# Patient Record
Sex: Female | Born: 1975 | Race: Black or African American | Hispanic: No | Marital: Married | Smoking: Never smoker
Health system: Southern US, Community
[De-identification: ages and names within clinical notes are randomized; demographics above are authoritative.]

## PROBLEM LIST (undated history)

## (undated) DIAGNOSIS — R000 Tachycardia, unspecified: Secondary | ICD-10-CM

## (undated) DIAGNOSIS — J302 Other seasonal allergic rhinitis: Secondary | ICD-10-CM

## (undated) HISTORY — PX: ABLATION OF DYSRHYTHMIC FOCUS: SHX254

---

## 1998-03-10 ENCOUNTER — Emergency Department (HOSPITAL_COMMUNITY): Admission: EM | Admit: 1998-03-10 | Discharge: 1998-03-10 | Payer: Self-pay | Admitting: Internal Medicine

## 2001-01-04 ENCOUNTER — Emergency Department (HOSPITAL_COMMUNITY): Admission: EM | Admit: 2001-01-04 | Discharge: 2001-01-04 | Payer: Self-pay | Admitting: Emergency Medicine

## 2001-01-04 ENCOUNTER — Encounter: Payer: Self-pay | Admitting: Emergency Medicine

## 2007-03-19 ENCOUNTER — Emergency Department (HOSPITAL_COMMUNITY): Admission: EM | Admit: 2007-03-19 | Discharge: 2007-03-19 | Payer: Self-pay | Admitting: Emergency Medicine

## 2008-04-04 ENCOUNTER — Encounter (INDEPENDENT_AMBULATORY_CARE_PROVIDER_SITE_OTHER): Payer: Self-pay | Admitting: Obstetrics and Gynecology

## 2008-04-04 ENCOUNTER — Ambulatory Visit: Payer: Self-pay | Admitting: Vascular Surgery

## 2008-04-04 ENCOUNTER — Ambulatory Visit: Admission: RE | Admit: 2008-04-04 | Discharge: 2008-04-04 | Payer: Self-pay | Admitting: Obstetrics and Gynecology

## 2008-04-27 ENCOUNTER — Inpatient Hospital Stay (HOSPITAL_COMMUNITY): Admission: AD | Admit: 2008-04-27 | Discharge: 2008-04-30 | Payer: Self-pay | Admitting: Obstetrics and Gynecology

## 2008-04-27 ENCOUNTER — Encounter (INDEPENDENT_AMBULATORY_CARE_PROVIDER_SITE_OTHER): Payer: Self-pay | Admitting: Obstetrics and Gynecology

## 2009-08-29 ENCOUNTER — Encounter: Payer: Self-pay | Admitting: Cardiovascular Disease

## 2009-09-07 DIAGNOSIS — R002 Palpitations: Secondary | ICD-10-CM

## 2009-09-12 ENCOUNTER — Ambulatory Visit: Payer: Self-pay | Admitting: Cardiovascular Disease

## 2009-09-12 ENCOUNTER — Telehealth (INDEPENDENT_AMBULATORY_CARE_PROVIDER_SITE_OTHER): Payer: Self-pay | Admitting: *Deleted

## 2009-09-14 ENCOUNTER — Ambulatory Visit: Payer: Self-pay

## 2009-09-14 ENCOUNTER — Ambulatory Visit: Payer: Self-pay | Admitting: Internal Medicine

## 2009-09-14 ENCOUNTER — Ambulatory Visit (HOSPITAL_COMMUNITY): Admission: RE | Admit: 2009-09-14 | Discharge: 2009-09-14 | Payer: Self-pay | Admitting: Cardiovascular Disease

## 2009-09-14 ENCOUNTER — Encounter: Payer: Self-pay | Admitting: Cardiovascular Disease

## 2009-09-18 ENCOUNTER — Telehealth (INDEPENDENT_AMBULATORY_CARE_PROVIDER_SITE_OTHER): Payer: Self-pay | Admitting: *Deleted

## 2009-09-18 ENCOUNTER — Ambulatory Visit: Payer: Self-pay | Admitting: Internal Medicine

## 2009-09-19 ENCOUNTER — Telehealth: Payer: Self-pay | Admitting: Internal Medicine

## 2009-10-02 ENCOUNTER — Telehealth: Payer: Self-pay | Admitting: Internal Medicine

## 2009-10-03 ENCOUNTER — Telehealth: Payer: Self-pay | Admitting: Internal Medicine

## 2009-10-04 ENCOUNTER — Encounter: Payer: Self-pay | Admitting: Internal Medicine

## 2009-10-16 ENCOUNTER — Telehealth (INDEPENDENT_AMBULATORY_CARE_PROVIDER_SITE_OTHER): Payer: Self-pay | Admitting: *Deleted

## 2009-10-17 ENCOUNTER — Ambulatory Visit: Payer: Self-pay | Admitting: Internal Medicine

## 2009-10-17 ENCOUNTER — Telehealth (INDEPENDENT_AMBULATORY_CARE_PROVIDER_SITE_OTHER): Payer: Self-pay | Admitting: *Deleted

## 2009-10-17 LAB — CONVERTED CEMR LAB
BUN: 8 mg/dL (ref 6–23)
Basophils Absolute: 0 10*3/uL (ref 0.0–0.1)
Basophils Relative: 0.3 % (ref 0.0–3.0)
CO2: 30 meq/L (ref 19–32)
Calcium: 9.1 mg/dL (ref 8.4–10.5)
Chloride: 105 meq/L (ref 96–112)
Creatinine, Ser: 0.8 mg/dL (ref 0.4–1.2)
Eosinophils Absolute: 0.1 10*3/uL (ref 0.0–0.7)
Eosinophils Relative: 1.7 % (ref 0.0–5.0)
GFR calc non Af Amer: 107.17 mL/min (ref >60)
Glucose, Bld: 108 mg/dL — ABNORMAL HIGH (ref 70–99)
HCT: 43.2 % (ref 36.0–46.0)
Hemoglobin: 13.7 g/dL (ref 12.0–15.0)
INR: 1.1 — ABNORMAL HIGH (ref 0.8–1.0)
Lymphocytes Relative: 28.3 % (ref 12.0–46.0)
Lymphs Abs: 2.2 10*3/uL (ref 0.7–4.0)
MCHC: 31.7 g/dL (ref 30.0–36.0)
MCV: 84.1 fL (ref 78.0–100.0)
Monocytes Absolute: 0.3 10*3/uL (ref 0.1–1.0)
Monocytes Relative: 3.4 % (ref 3.0–12.0)
Neutro Abs: 5.1 10*3/uL (ref 1.4–7.7)
Neutrophils Relative %: 66.3 % (ref 43.0–77.0)
Platelets: 212 10*3/uL (ref 150.0–400.0)
Potassium: 3.7 meq/L (ref 3.5–5.1)
Prothrombin Time: 11.4 s (ref 9.1–11.7)
RBC: 5.13 M/uL — ABNORMAL HIGH (ref 3.87–5.11)
RDW: 13.6 % (ref 11.5–14.6)
Sodium: 142 meq/L (ref 135–145)
WBC: 7.7 10*3/uL (ref 4.5–10.5)
aPTT: 25.1 s (ref 21.7–28.8)
hCG, Beta Chain, Quant, S: <0.5 milliintl units/mL

## 2009-10-18 ENCOUNTER — Telehealth: Payer: Self-pay | Admitting: Internal Medicine

## 2009-10-23 ENCOUNTER — Ambulatory Visit: Payer: Self-pay | Admitting: Internal Medicine

## 2009-10-23 ENCOUNTER — Ambulatory Visit (HOSPITAL_COMMUNITY): Admission: RE | Admit: 2009-10-23 | Discharge: 2009-10-24 | Payer: Self-pay | Admitting: Internal Medicine

## 2009-10-31 ENCOUNTER — Telehealth: Payer: Self-pay | Admitting: Internal Medicine

## 2009-11-20 ENCOUNTER — Ambulatory Visit: Payer: Self-pay | Admitting: Internal Medicine

## 2009-11-20 DIAGNOSIS — I471 Supraventricular tachycardia: Secondary | ICD-10-CM

## 2010-08-15 ENCOUNTER — Telehealth: Payer: Self-pay | Admitting: Internal Medicine

## 2010-08-15 ENCOUNTER — Emergency Department (HOSPITAL_COMMUNITY)
Admission: EM | Admit: 2010-08-15 | Discharge: 2010-08-15 | Payer: Self-pay | Source: Home / Self Care | Admitting: Emergency Medicine

## 2010-08-21 NOTE — Assessment & Plan Note (Signed)
Summary: nep/ ablation, svt  / per dr. Eden Emms appt is 3:45/gd   Visit Type:  Initial Consult Primary Provider:  Celestia Khat, MD  CC:  When pt went to ER.  and her heart rate 1was 190.Marland Kitchen  History of Present Illness: Virginia Vasquez is referred today by Dr. 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  The patient has had good health until several weeks ago when she first experienced SVT and required Adenosine for termination.  She was placed on beta blockers and had a recurrent episode earlier today and was found to have recurrent SVT while wearing a cardiac monitor.  She has a narrow QRS tachycardia at 220/min with QRS alternans.  These episodes are associated with dizziness, chest pressure and sob. No syncope.  Current Medications (verified): 1)  Metoprolol Succinate 50 Mg Xr24h-Tab (Metoprolol Succinate) .... Take One Tablet By Mouth Daily 2)  Zyrtec Allergy 10 Mg Tabs (Cetirizine Hcl) .... As Needed 3)  Diltiazem Hcl Er Beads 120 Mg Xr24h-Cap (Diltiazem Hcl Er Beads) .... Take One Capsule By Mouth Twice A Day  Allergies (verified): 1)  ! Vioxx  Past History:  Past Medical History: Last updated: 09/07/2009 Current Problems:  PALPITATIONS (ICD-785.1)  Past Surgical History: Last updated: 09/07/2009   Primary low segment transverse cesarean section.      Family History: Last updated: 09/07/2009  She has a family history of hypertension, diabetes,   lung cancer, and ovarian cancer.   Social History: Last updated: 09/12/2009  She is a nonsmoker and nondrinker.  Denies domestic or   physical violence.  Dentist.  Married One son named Cristal Deer  Review of Systems       All systems reviewed and negative except as noted in the HPI.  Vital Signs:  Patient profile:   35 year old female Height:      65 inches Weight:      171 pounds BMI:     28.56 Pulse rate:   85 / minute BP sitting:   102 / 62  (left arm)  Vitals Entered By: Laurance Flatten CMA (September 18, 2009 3:11  PM)  Physical Exam  General:  Affect appropriate Healthy:  appears stated age HEENT: normal Neck supple with no adenopathy JVP normal no bruits no thyromegaly Lungs clear with no wheezing and good diaphragmatic motion Heart:  S1/S2 no murmur,rub, gallop or click PMI normal Abdomen: benighn, BS positve, no tenderness, no AAA no bruit.  No HSM or HJR Distal pulses intact with no bruits No edema Neuro non-focal Skin warm and dry    Event Monitor  Procedure date:  09/18/2009  Findings:      NSR with SVT at 220/min.  No ventricular pre-excitation.  Impression & Recommendations:  Problem # 1:  PALPITATIONS (ICD-785.1) She has documented SVT despite medical therapy with beta blockers and now calcium channel blocker.  I have discussed the treatment options with the paitent and the risk/benefit/goal/expectations of EP study and RF ablation of her SVT have been discussed and she will call us if she decides to schedule an ablation. Her updated medication list for this problem includes:    Metoprolol Succinate 50 Mg Xr24h-tab (Metoprolol succinate) .Marland Kitchen... Take one tablet by mouth daily    Diltiazem Hcl Er Beads 120 Mg Xr24h-cap (Diltiazem hcl er beads) .Marland Kitchen... Take one capsule by mouth twice a day

## 2010-08-21 NOTE — Assessment & Plan Note (Signed)
Summary: np3/ palps/ pt has bcbs/ gd   CC:  palpitations.  History of Present Illness: Virginia Vasquez is seen today at the request of her primary Dr Alferd Apa.  She was in the [place] ER on 2/7 with palpitations that made her feel presyncopal or light headed.  She describes rapid heart beats that lasted minutes.  ER eval was negative including CT.  I don't have blood work to see if TSH was checked.  No previous heart disease, murmur.  No new meds, stress or drugs.  Symptoms persisting with additional symptom of headache.  No true syncope, does get SSCP on occasion with palpitations.  Feels weak and not herself the last month.  No fever.    Cardiovascular Risk History:      Negative major cardiovascular risk factors include female age less than 99 years old.    Current Problems (verified): 1)  Palpitations  (ICD-785.1)  Current Medications (verified): 1)  Metoprolol Succinate 50 Mg Xr24h-Tab (Metoprolol Succinate) .... Take One Tablet By Mouth Daily 2)  Zyrtec Allergy 10 Mg Tabs (Cetirizine Hcl) .... As Needed  Past History:  Past Medical History: Last updated: 09/07/2009 Current Problems:  PALPITATIONS (ICD-785.1)  Past Surgical History: Last updated: 09/07/2009   Primary low segment transverse cesarean section.      Family History: Last updated: 09/07/2009  She has a family history of hypertension, diabetes,   lung cancer, and ovarian cancer.   Social History: Last updated: 09/12/2009  She is a nonsmoker and nondrinker.  Denies domestic or   physical violence.  Dentist.  Married One son named Corporate treasurer  Social History:  She is a nonsmoker and nondrinker.  Denies domestic or   physical violence.  Dentist.  Married One son named Cristal Deer  Review of Systems       Denies fever, malais, weight loss, blurry vision, decreased visual acuity, cough, sputum, SOB, hemoptysis, pleuritic pain,s, heartburn, abdominal pain, melena, lower extremity edema,  claudication, or rash. All other systems reviewed and negative  Vital Signs:  Patient profile:   35 year old female Height:      65 inches Weight:      171 pounds BMI:     28.56 Pulse rate:   90 / minute Resp:     12 per minute BP sitting:   123 / 77  (left arm)  Vitals Entered By: Kem Parkinson (September 12, 2009 2:01 PM)  Physical Exam  General:  Affect appropriate Healthy:  appears stated age HEENT: normal Neck supple with no adenopathy JVP normal no bruits no thyromegaly Lungs clear with no wheezing and good diaphragmatic motion Heart:  S1/S2 no murmur,rub, gallop or click PMI normal Abdomen: benighn, BS positve, no tenderness, no AAA no bruit.  No HSM or HJR Distal pulses intact with no bruits No edema Neuro non-focal Skin warm and dry    Impression & Recommendations:  Problem # 1:  PALPITATIONS (ICD-785.1) Wtih SSCP, and presyncope.  Negative ER w/u , normal ECG and normal exam.  F/U event monitor and echo to R/O structural heart disease.  Continue BB for now.  f/U in 6 weeds  R/O SVT Her updated medication list for this problem includes:    Metoprolol Succinate 50 Mg Xr24h-tab (Metoprolol succinate) .Marland Kitchen... Take one tablet by mouth daily  Orders: Echocardiogram (Echo) Event (Event)  Cardiovascular Risk Assessment/Plan:      The patient's hypertensive risk group is category A: No risk factors and no target organ damage.  Today's blood pressure  is 123/77.    Patient Instructions: 1)  Your physician recommends that you schedule a follow-up appointment in: 6 WEEKS 2)  Your physician has requested that you have an echocardiogram.  Echocardiography is a painless test that uses sound waves to create images of your heart. It provides your doctor with information about the size and shape of your heart and how well your heart's chambers and valves are working.  This procedure takes approximately one hour. There are no restrictions for this procedure. 3)  Your  physician has recommended that you wear an event monitor.  Event monitors are medical devices that record the heart's electrical activity. Doctors most often use these monitors to diagnose arrhythmias. Arrhythmias are problems with the speed or rhythm of the heartbeat. The monitor is a small, portable device. You can wear one while you do your normal daily activities. This is usually used to diagnose what is causing palpitations/syncope (passing out).     EKG Report  Procedure date:  09/12/2009  Findings:      NSR  Normal ECG

## 2010-08-21 NOTE — Progress Notes (Signed)
Summary: ttalk to nurse/ calling  back to set up procedure  Phone Note Call from Patient Call back at Home Phone [phone]   Caller: Patient Summary of Call: pt would like to talk to nurse. Initial call taken by: Edman Circle,  September 19, 2009 2:44 PM  Follow-up for Phone Call        [facility] for pt to call me back to sch SVT ablation Dennis Bast, RN, BSN  September 19, 2009 4:26 PM  Per pt calling [phone] Lorne Skeens  September 21, 2009 9:21 AM  called pt back and gave her several days to consider.  She will talk with her husband, who works at the same school, and call me back. Dennis Bast, RN, BSN  September 21, 2009 9:36 AM

## 2010-08-21 NOTE — Assessment & Plan Note (Signed)
Summary: eph/post ablation per Matt/lg   Visit Type:  Follow-up Primary Provider:  Celestia Khat, MD   History of Present Illness: Ms. Ringel returns today for followup.  She is a pleasant 35 yo woman with a h/o SVT who underwent EPS/RFA several weeks ago.  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  She denies c/p, sob, or SVT.  She has rare palpitations.  Current Medications (verified): 1)  Zyrtec Allergy 10 Mg Tabs (Cetirizine Hcl) .... As Needed  Allergies (verified): 1)  ! Vioxx  Past History:  Past Medical History: Last updated: 09/07/2009 Current Problems:  PALPITATIONS (ICD-785.1)  Past Surgical History: Last updated: 09/07/2009   Primary low segment transverse cesarean section.      Review of Systems  The patient denies chest pain, syncope, dyspnea on exertion, and peripheral edema.    Vital Signs:  Patient profile:   35 year old female Height:      65 inches Weight:      173 pounds BMI:     28.89 Pulse rate:   73 / minute BP sitting:   140 / 86  (left arm)  Vitals Entered By: Laurance Flatten CMA (Nov 20, 2009 10:19 AM)  Physical Exam  General:  Affect appropriate Healthy:  appears stated age HEENT: normal Neck supple with no adenopathy JVP normal no bruits no thyromegaly Lungs clear with no wheezing and good diaphragmatic motion Heart:  S1/S2 no murmur,rub, gallop or click PMI normal Abdomen: benighn, BS positve, no tenderness, no AAA no bruit.  No HSM or HJR Distal pulses intact with no bruits No edema Neuro non-focal Skin warm and dry    EKG  Procedure date:  11/20/2009  Findings:      Normal sinus rhythm with rate of:  78.  Impression & Recommendations:  Problem # 1:  PSVT (ICD-427.0) No recurrent symptoms since ablation She is off of her meds.  She is instructed to call us for recurrent SVT.  Problem # 2:  PALPITATIONS (ICD-785.1) Her symptoms are minimal at this time.  She will  call us if they return.  Other Orders: EKG w/ Interpretation (93000)  Patient Instructions: 1)  Your physician recommends that you schedule a follow-up appointment as needed  Prevention & Chronic Care Immunizations   Influenza vaccine: Not documented    Tetanus booster: Not documented    Pneumococcal vaccine: Not documented  Other Screening   Pap smear: Not documented   Smoking status: Not documented

## 2010-08-21 NOTE — Progress Notes (Signed)
Summary: set up ablation**lmtcb**  Phone Note Call from Patient Call back at Home Phone [phone]   Caller: Patient Reason for Call: Talk to Nurse Details for Reason: Per pt calling, pt aware kelly is not in office today, set up ablation. Initial call taken by: Lorne Skeens,  October 02, 2009 9:51 AM  Follow-up for Phone Call        spoke with pt, procedure tentatively scheduled for 11-13-09 at 7:30am. will foward to [facility], dr taylor's nurse to confirm with the pt and to go over instructions. pt aware of date and time but aware kelly to call to confirm Deliah Goody, RN  October 02, 2009 10:52 AM Procedure changed to 10/23/09  pt aware and will come in for labs on 10/17/09 Dennis Bast, RN, BSN  October 09, 2009 9:32 AM

## 2010-08-21 NOTE — Progress Notes (Signed)
Summary: elevated hr/sob  Phone Note Call from Patient Call back at Home Phone [phone]   Caller: Patient Reason for Call: Talk to Nurse Summary of Call: heart racing for the last , alittle SOB Initial call taken by: Migdalia Dk,  October 03, 2009 10:07 AM  Follow-up for Phone Call        school nurse called from [employer] had been called to the school because pt had an episode of fast heart rate--per school nurse EMS was with the patient and was going to make a determination about further treatment -I advised the nurse EMS was in a better position to make further recommendations but evaluation in ER was an option

## 2010-08-21 NOTE — Progress Notes (Signed)
  Phone Note Other Incoming   Caller: Elsie Ra Summary of Call: Pt had 45 min of SVT last night with c/o SOB, heart racing, numbness in face and finger tips, Dr. Gala Romney was paged by Kenyon Ana. Not certain what he said but Kenyon Ana ask the husband to take her to the nearest ER because the pt began to panic. She was taken to [facility] ER around 0800 and Dr. Chandra Batch called him to let him know that the pt was given medication and she was back in NSR.  Initial call taken by: Duncan Dull, RN, BSN,  September 18, 2009 9:06 AM     Appended Document:  Lynetta Mare to get patient in to see GT today for ? AV node modification.  Add Cardizem 240

## 2010-08-21 NOTE — Progress Notes (Signed)
Summary: need information in regards to surgery  Phone Note Call from Patient Call back at Home Phone [phone]   Caller: Patient Reason for Call: Talk to Nurse Summary of Call: procedure scheudle for monday. info. Initial call taken by: Lorne Skeens,  October 18, 2009 2:32 PM  Follow-up for Phone Call        was suppose to pick up instructions the day she had her labs.  Left instructions on her voicemail and let her know we can fax if we need too.  She can call back and leave [place] a # to fax back to Dennis Bast, RN, BSN  October 18, 2009 2:52 PM

## 2010-08-21 NOTE — Progress Notes (Signed)
  Faxed Cardiac over to Joycelyn Schmid w/ [employer] to fax [phone] [facility]  October 16, 2009 10:24 AM

## 2010-08-21 NOTE — Consult Note (Signed)
Summary: [facility]  [facility]   Imported By: Marylou Mccoy 09/07/2009 16:04:32  _____________________________________________________________________  External Attachment:    Type:   Image     Comment:   External Document

## 2010-08-21 NOTE — Progress Notes (Signed)
  Faxed ROI over to [facility] @ fax [phone]. Virginia Vasquez  September 12, 2009 3:10 PM    Appended Document:  Recieved Records form [facility] forwarded to [place]

## 2010-08-21 NOTE — Letter (Signed)
Summary: ELectrophysiology/Ablation Procedure Instructions  [employer], Main Office  [address]   [place], [place] [address]   Phone: [phone]  Fax: [phone]     Electrophysiology/Ablation Procedure Instructions    You are scheduled for a(n) SVT ablation on 10/23/09 at 8:30am with Dr. Ladona Ridgel.  1.  Please come to the Short Stay Center at [facility] at 6:30am on the day of your procedure.  2.  Come prepared to stay overnight.   Please bring your insurance cards and a list of your medications.  3.  Come to the [place] office on 10/17/09 at 1:00pm  for lab work.  The lab at [facility] is open from 8:30 AM to 1:30 PM and 2:30 PM to 5:00 PM.  You do not have to be fasting.  4.  Do not have anything to eat or drink after midnight the night before your procedure.  5.   All of your remaining medications may be taken with a small amount of water.  6.  Educational material received:   _ Ablation   * Occasionally, EP studies and ablations can become lengthy.  Please make your family aware of this before your procedure starts.  Average time ranges from 2-8 hours for EP studies/ablations.  Your physician will locate your family after the procedure with the results.  * If you have any questions after you get home, please call the office at [phone]. Anselm Pancoast

## 2010-08-21 NOTE — Progress Notes (Signed)
Summary: Lump at cath site size of a grape  Phone Note Call from Patient Call back at Home Phone [phone]   Caller: Patient Summary of Call: Pt have lump at cath site  Initial call taken by: Judie Grieve,  October 31, 2009 10:45 AM  Follow-up for Phone Call        lmom to call me back  Dennis Bast, RN, BSN  October 31, 2009 10:58 AM Never rec'd call back from pt.  Called again to make sure everything was ok.  LMOM again.  Asked to call the office if she felt she needed to have the area looked at. Dennis Bast, RN, BSN  November 02, 2009 12:41 PM

## 2010-08-21 NOTE — Progress Notes (Signed)
  Pt dropped off FMLA papers @ [employer]..she signed ROI,but I was not able to give her the Packet...she left.Marland Kitchen.forwarded to [employer] for processing [facility]  October 17, 2009 1:03 PM    Appended Document:  Forwarded FMLA,ROI over to [employer] ( papers were left on my desk)   Appended Document:  Pt called checking Status Of FMLA papers, she says no one ever contacted her, I let her know I had papers and they were Completed. She will Pick- up today   Appended Document:  Pt, came and picked up FMLA papers   Appended Document:  Pt called back with Concerns on FMLA papers, Part A # 1 was filled out Incorrectly,Part B # 5 was also not filled in with Dates of work missed, I called and spoke with Fleet Contras and asked if she could complete this and fax back to me Pt was here I faxed the FMLA papers over to Her, she said she would complete and get Pam H to Initial that it was corrected, She faxed paper back to me but it was Incorrect,And I was also told Pam H would not sign I needed to get one of the Nurse's over here to Complete. I went back to the Pt in the Lobby spoke with pt about this she aslo stated it was incorrect, and we walked Down to her Car together for her to give me all the Information of where the FMLA needs to be sent, Fax and Phone # of Ignatius Specking. I told her I would Personally fax these papers over myself and call her when all complete. She will come back and p/up Original copy.Pt was ok with the whole process,Just a little anxious these papers were dropped off on 10/17/2009  Appended Document:  On 11/06/09 I was asked by Fleet Contras, ([employer] Rep) to initial FMLA papers because the patient arrived at [facility]. requesting a statement to be added to papers about the patient needing 2 more weeks added to her FMLA for an ablation. Fleet Contras told me that she couldn't find anything about the ablation. I told her I didn't feel good about initialing it if she couldn't find anything about it  and that if she needed to add this it would be best to have the doctor approve & re-sign.  She said she would let Selena Batten know and send it back for the nurse to review and for signature.  Appended Document:  Dr.Nishan reviewed & Signed New FMLA papers that Gina fixed, I faxed over to [facility] and also called pt to let her know they are ready to be picked up

## 2010-08-23 NOTE — Progress Notes (Signed)
Summary: c/o chestpain. foot isnumb  Phone Note Call from Patient Call back at Home Phone [phone]   Caller: Patient Reason for Call: Talk to Nurse Complaint: Chest Pain Summary of Call: c/o left foot going numb on yesterday and this am. chestpain. no meds was taken.  Initial call taken by: Lorne Skeens,  August 15, 2010 9:16 AM  Follow-up for Phone Call        pt called now hands going numb, doesn't want a call, going to er i have paged trish  Glynda Jaeger  August 15, 2010 9:29 AM  noted that pt going to er and Rosann Auerbach has been called. will forward to md.  Follow-up by: Claris Gladden RN,  August 15, 2010 10:08 AM

## 2010-12-04 NOTE — Op Note (Signed)
Virginia Vasquez, Virginia Vasquez NO.:  [id number]   MEDICAL RECORD NO.:  [id number]          PATIENT TYPE:  INP   LOCATION:  9110                          FACILITY:  [facility]   PHYSICIAN:  Lenoard Aden, M.D.DATE OF BIRTH:  06-20-1976   DATE OF PROCEDURE:  DATE OF DISCHARGE:                               OPERATIVE REPORT   PREOPERATIVE DIAGNOSES:  Active phase arrest, term intrauterine  pregnancy, maternal fever in labor, fetal tachycardia.   POSTOPERATIVE DIAGNOSES:  Active phase arrest, term intrauterine  pregnancy, maternal fever in labor, fetal tachycardia, plus persistent  occiput posterior position.   PROCEDURE:  Primary low segment transverse cesarean section.   SURGEON:  Lenoard Aden, MD   ANESTHESIA:  Epidural by Oddono.   ESTIMATED BLOOD LOSS:  1000 mL.   COMPLICATIONS:  None.   DRAINS:  Foley.   COUNTS:  Correct.   The patient recovered in good condition.   FINDINGS:  Full-term living female, occiput posterior position, Apgars 8  and 9.  Pediatrician in attendance.  Two-layer uterine closure, normal  tubes, normal ovaries.   BRIEF OPERATIVE NOTE:  Being apprised of risks of anesthesia, infection,  bleeding, intra-abdominal injury, need for repair, delayed versus  immediate complications include bowel and bladder injury.  The patient  was brought to the operating where she was administered a dosing of  epidural anesthetic without complications, prepped, and draped in usual  sterile fashion.  Foley catheter placed after achieving adequate  anesthesia, dilute Marcaine solution placed.  Pfannenstiel skin incision  made with the scalpel, carried down the fascia which nicked in the  midline and then transversely using Mayo scissors, rectus muscles  dissected sharply in midline, peritoneum entered sharply.  Bladder blade  placed.  Visceral peritoneum scored sharply off the liver in segment.  Kerr hysterotomy incision made atraumatic delivery from  occiput  posterior position.  Full-term living female was noted, Apgars 8 and 9.  Cord blood collected.  Placenta remained manually intact, three-vessel  cord, uterus curetted using a dry lap pack and closed in 2 running  imbricating layers of 0 Monocryl suture.  Multiple interrupted sutures  placed in the midline for hemostasis.  Normal tubes, normal ovaries palpable.  Inspection reveals a good  hemostatic bladder flap.  Fascia was then closed using a 0 Monocryl in  continuous running fashion.  Skin closed using staples.  The patient  tolerated the procedure well and was transferred to recovery in good  condition.      Lenoard Aden, M.D.  Electronically Signed     RJT/MEDQ  D:  04/27/2008  T:  04/28/2008  Job:  [id number]

## 2010-12-04 NOTE — H&P (Signed)
Virginia Vasquez, SHIBUYA NO.:  [id number]   MEDICAL RECORD NO.:  [id number]          PATIENT TYPE:  INP   LOCATION:  9168                          FACILITY:  [facility]   PHYSICIAN:  Lenoard Aden, M.D.DATE OF BIRTH:  1976/01/10   DATE OF ADMISSION:  04/27/2008  DATE OF DISCHARGE:                              HISTORY & PHYSICAL   CHIEF COMPLAINT:  Spontaneous rupture of membranes at 4:55 a.m.   She is a 35 year old white female G1, P0 at 38-plus weeks' gestation who  presents with spontaneous rupture of membranes as noted.   ALLERGIES:  She has no known drug allergies.   MEDICATIONS:  Prenatal vitamins.   SOCIAL HISTORY:  She is a nonsmoker and nondrinker.  Denies domestic or  physical violence.   FAMILY HISTORY:  She has a family history of hypertension, diabetes,  lung cancer, and ovarian cancer.   PRENATAL COURSE:  Complicated by size-date discrepancy, borderline  polyhydramnios, and questionably large for gestational age fetus.   PHYSICAL EXAMINATION:  GENERAL:  She is an uncomfortable-appearing white  female.  VITAL SIGNS:  Stable.  The patient is afebrile.  HEENT:  Normal.  LUNGS:  Clear.  HEART:  Regular rate and rhythm.  ABDOMEN:  Soft, gravid, and nontender.  Cervix per RN 1-2, 50% vertex, -  2.  EXTREMITIES:  There are no cords.  NEUROLOGIC:  Nonfocal.  SKIN:  Intact.   IMPRESSION:  Term intrauterine pregnancy with spontaneous rupture of  membranes, group B Streptococcus negative.   PLAN:  Admit, epidural as needed, and anticipate attempt at vaginal  delivery.      Lenoard Aden, M.D.  Electronically Signed     RJT/MEDQ  D:  04/27/2008  T:  04/27/2008  Job:  [id number]

## 2010-12-07 NOTE — Discharge Summary (Signed)
NAMEFAELYNN, WYNDER NO.:  [id number]   MEDICAL RECORD NO.:  [id number]          PATIENT TYPE:  INP   LOCATION:  9110                          FACILITY:  [facility]   PHYSICIAN:  Lenoard Aden, M.D.DATE OF BIRTH:  1975-08-28   DATE OF ADMISSION:  04/27/2008  DATE OF DISCHARGE:  04/30/2008                               DISCHARGE SUMMARY   The patient underwent uncomplicated primary C-section on April 27, 2008.  Postoperative course uncomplicated.  She has tolerated regular  diet well.  Discharged to home on day #3.  Discharge teaching done.   DISCHARGE MEDICATIONS:  1. Tylox.  2. Prenatal vitamins.  3. Iron.   DISCHARGE STATUS:  Good.   FOLLOWUP:  Follow up in the office in 4-6 weeks.      Lenoard Aden, M.D.  Electronically Signed     RJT/MEDQ  D:  06/14/2008  T:  06/14/2008  Job:  [id number]

## 2011-04-22 LAB — CBC
HCT: 35.8 — ABNORMAL LOW
HCT: 41.3
Hemoglobin: 13.4
MCHC: 32.2
MCHC: 32.3
MCV: 79.4
MCV: 79.8
Platelets: 172
RBC: 5.2 — ABNORMAL HIGH
RDW: 14.4
WBC: 16.6 — ABNORMAL HIGH

## 2012-02-16 ENCOUNTER — Encounter (HOSPITAL_BASED_OUTPATIENT_CLINIC_OR_DEPARTMENT_OTHER): Payer: Self-pay | Admitting: *Deleted

## 2012-02-16 ENCOUNTER — Emergency Department (HOSPITAL_BASED_OUTPATIENT_CLINIC_OR_DEPARTMENT_OTHER)
Admission: EM | Admit: 2012-02-16 | Discharge: 2012-02-16 | Disposition: A | Discharge status: Home / Self Care | Payer: BC Managed Care – PPO | Attending: Emergency Medicine | Admitting: Emergency Medicine

## 2012-02-16 DIAGNOSIS — K029 Dental caries, unspecified: Secondary | ICD-10-CM | POA: Insufficient documentation

## 2012-02-16 DIAGNOSIS — K047 Periapical abscess without sinus: Secondary | ICD-10-CM

## 2012-02-16 HISTORY — DX: Tachycardia, unspecified: R00.0

## 2012-02-16 HISTORY — DX: Other seasonal allergic rhinitis: J30.2

## 2012-02-16 MED ORDER — PENICILLIN V POTASSIUM 500 MG PO TABS: 500.0000 mg | ORAL_TABLET | Freq: Three times a day (TID) | ORAL | Status: AC

## 2012-02-16 NOTE — ED Provider Notes (Signed)
History     CSN: [id number]  Arrival date & time 02/16/12  1502   First MD Initiated Contact with Patient 02/16/12 1514      Chief Complaint  Patient presents with  . Dental Pain    (Consider location/radiation/quality/duration/timing/severity/associated sxs/prior treatment) Patient is a 36 y.o. female presenting with tooth pain. The history is provided by the patient.  Dental Pain  Patient presents to emergency department complaining of a multiple year history of intermittent mild dental pain with associated gingival swelling. Patient states that she just recently got dental insurance and has a followup visit with a dentist on Friday, 5 days from now, however she states that she is going out of town and was worried that the swelling at the base of her tooth could be infection and she wanted to get put on antibiotics prior to going on vacation. Patient denies any fevers, chills, difficulty breathing or swallowing, or facial swelling. Patient states that she easily controls the pain with Tylenol and ibuprofen. She states that this current gingival swelling and dental pain was gradual onset and began 3 days ago and has been mildly progressing in severity however once again states that the pain is mild. Past Medical History  Diagnosis Date  . Seasonal allergies   . Tachycardia     Past Surgical History  Procedure Date  . Ablation of dysrhythmic focus     No family history on file.  History  Substance Use Topics  . Smoking status: Never Smoker   . Smokeless tobacco: Never Used  . Alcohol Use: No    OB History    Grav Para Term Preterm Abortions TAB SAB Ect Mult Living                  Review of Systems  All other systems reviewed and are negative.    Allergies  Rofecoxib  Home Medications   Current Outpatient Rx  Name Route Sig Dispense Refill  . IBUPROFEN 200 MG PO TABS Oral Take 400 mg by mouth every 6 (six) hours as needed. For pain    . LEVONORGESTREL 20  MCG/24HR IU IUD Intrauterine 1 each by Intrauterine route once. Inserted in April 2013    . ADULT MULTIVITAMIN W/MINERALS CH Oral Take 1 tablet by mouth daily.    Marland Kitchen PENICILLIN V POTASSIUM 500 MG PO TABS Oral Take 1 tablet (500 mg total) by mouth 3 (three) times daily. 30 tablet 0    BP 144/73  Pulse 93  Temp 98.3 F (36.8 C) (Oral)  Resp 18  Ht 5\' 4"  (1.626 m)  Wt 164 lb (74.39 kg)  BMI 28.15 kg/m2  SpO2 100%  Physical Exam  Nursing note and vitals reviewed. Constitutional: She is oriented to person, place, and time. She appears well-developed and well-nourished.  HENT:  Head: Normocephalic and atraumatic.       Massive dental decay of left lower molars with TTP of surrounding gingiva small amount of fluctuance and fullness at base of 1st molar with mild TTP.   Eyes: Conjunctivae are normal.  Neck: Normal range of motion. Neck supple.  Cardiovascular: Normal rate and regular rhythm.   Pulmonary/Chest: Effort normal and breath sounds normal.  Musculoskeletal: Normal range of motion.  Lymphadenopathy:    She has no cervical adenopathy.  Neurological: She is alert and oriented to person, place, and time.  Skin: Skin is warm and dry.  Psychiatric: She has a normal mood and affect. Her behavior is normal.  ED Course  Procedures (including critical care time)  Patient states pain is mild and that she will take tylenol or ibuprofen at home.  Labs Reviewed - No data to display No results found.   1. Dental decay   2. Dental abscess       MDM  Very small dental abscess involving gingiva of left lower molar without airway involvement and patient is non toxic appearing. She has appt with dentist on Friday and desires to hold off on any procedural intervention until Friday which I believe is reasonable given the small size of abscess. Will place on abx. Patient states pain is so mild that it is easily managed with tylenol and ibuprofen at home.          McDonald,  [place] 02/16/12 1531

## 2012-02-16 NOTE — ED Notes (Signed)
Rx x 1 given for penicillin 

## 2012-02-16 NOTE — ED Notes (Signed)
Pt reports she has "knots" on her gum

## 2012-02-18 NOTE — ED Provider Notes (Signed)
Medical screening examination/treatment/procedure(s) were performed by non-physician practitioner and as supervising physician I was immediately available for consultation/collaboration.  Lakina Mcintire, MD 02/18/12 1039 

## 2012-06-25 ENCOUNTER — Telehealth: Payer: Self-pay | Admitting: Internal Medicine

## 2012-06-25 NOTE — Telephone Encounter (Signed)
lmom for pt  That we have not seen her in 2 years and it is hard to make any recommendations based on that.  She can go to the ER if her symptoms do not improve or call her PCP

## 2012-06-25 NOTE — Telephone Encounter (Signed)
Pt was seen about two years tachycardia/ had oral surgery yesterday, was given hydrocodone, having same symptoms , is this ok for her to continue to take [phone]

## 2012-08-25 IMAGING — CR DG CHEST 2V
2 series · 2 of 2 positions shown · non-contrast
Comparison: None.

CLINICAL DATA: Chest pain, history of supraventricular tachycardia
with ablation

CHEST - 2 VIEW

[view not recorded (1 of 2)]
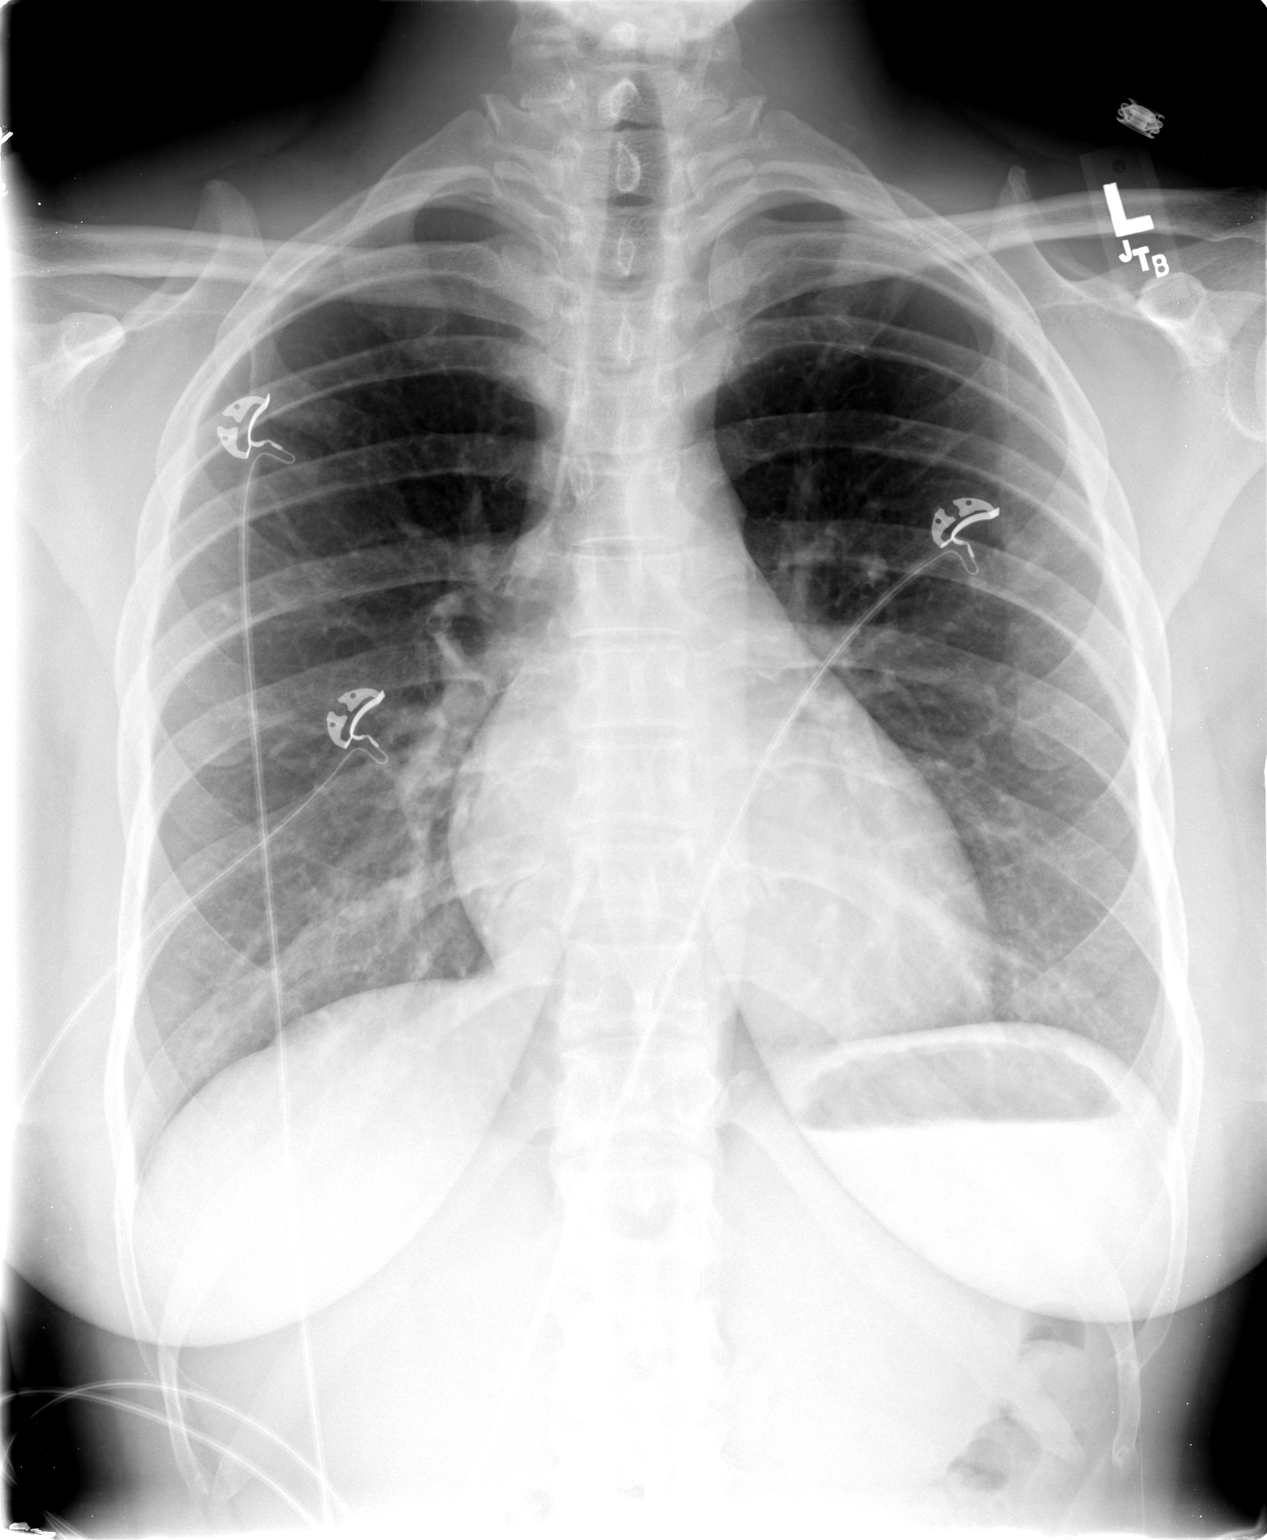

[view not recorded (2 of 2)]
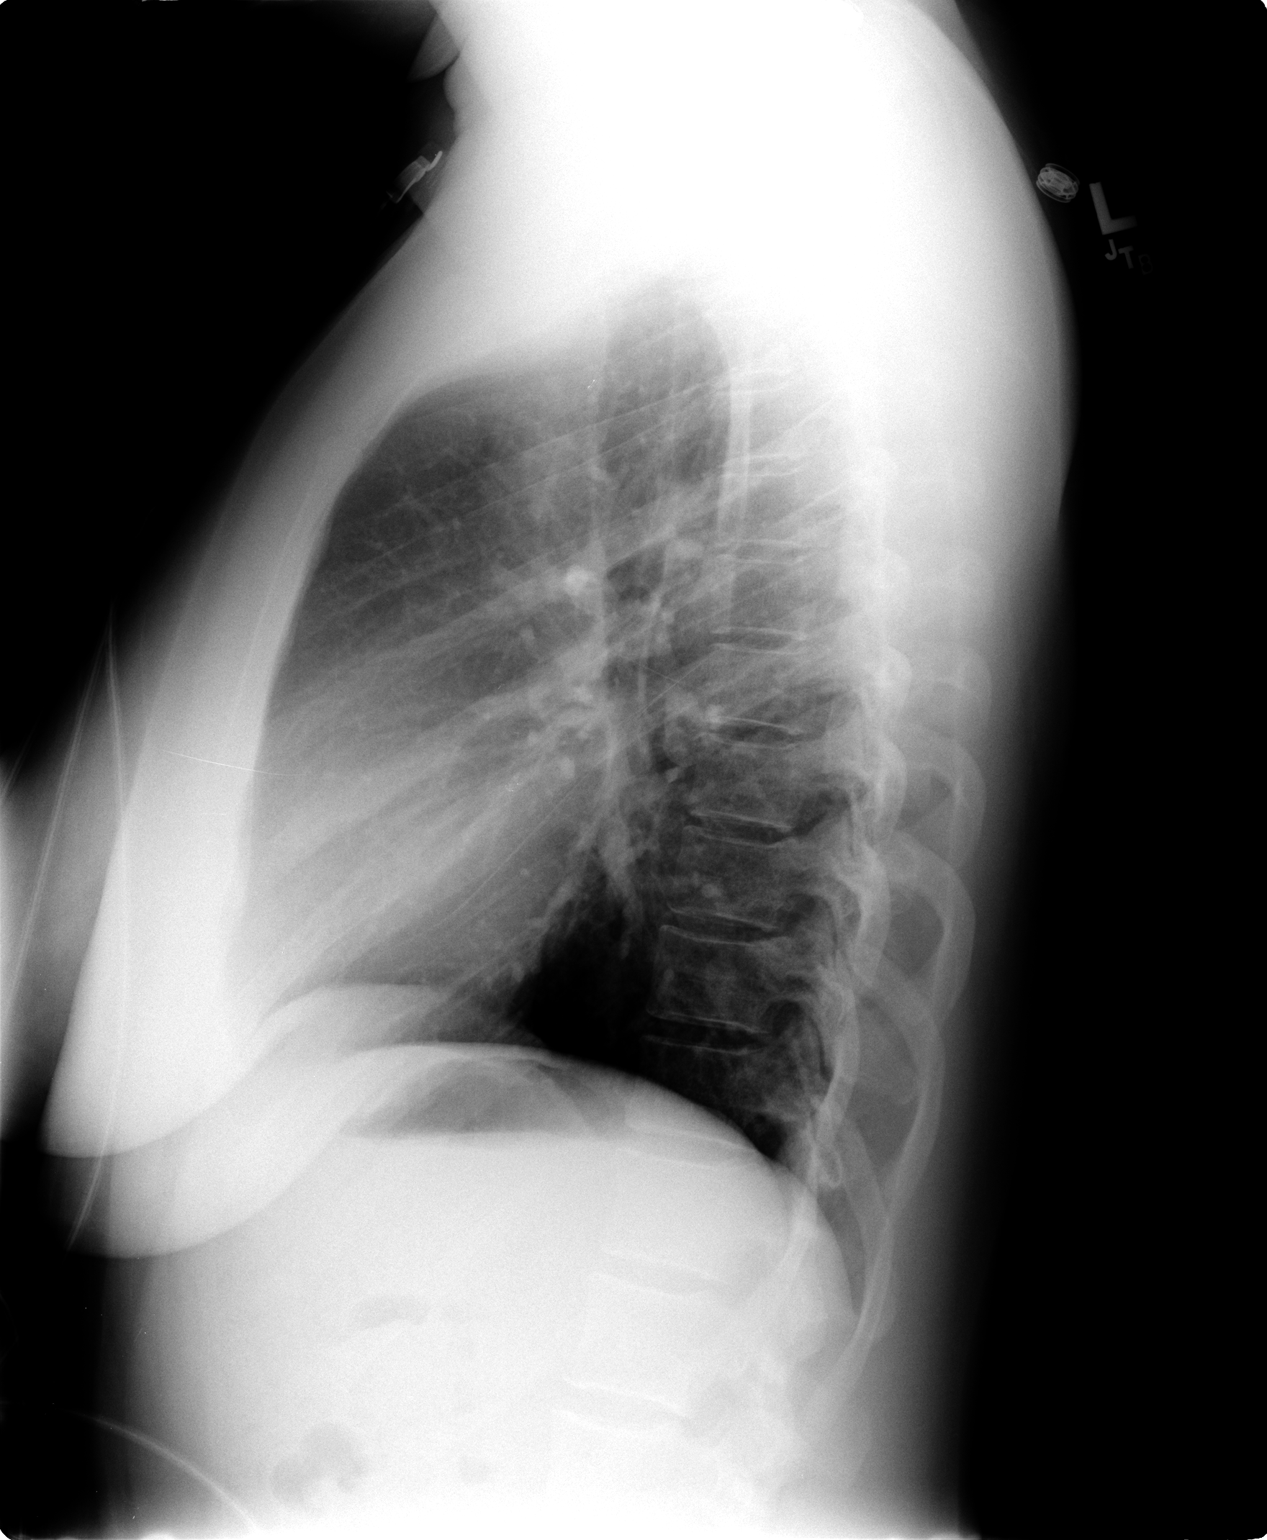

[2 of 2 positions shown; findings below may reference images not displayed]

FINDINGS: The lungs are clear.  Mediastinal contours appear normal.
The heart is within normal limits in size.  No bony abnormality is
seen.
IMPRESSION: No active lung disease.

## 2013-08-08 ENCOUNTER — Emergency Department (HOSPITAL_BASED_OUTPATIENT_CLINIC_OR_DEPARTMENT_OTHER)
Admission: EM | Admit: 2013-08-08 | Discharge: 2013-08-08 | Disposition: A | Discharge status: Home / Self Care | Payer: BC Managed Care – PPO | Attending: Emergency Medicine | Admitting: Emergency Medicine

## 2013-08-08 ENCOUNTER — Encounter (HOSPITAL_BASED_OUTPATIENT_CLINIC_OR_DEPARTMENT_OTHER): Payer: Self-pay | Admitting: Emergency Medicine

## 2013-08-08 DIAGNOSIS — R209 Unspecified disturbances of skin sensation: Secondary | ICD-10-CM | POA: Insufficient documentation

## 2013-08-08 DIAGNOSIS — R202 Paresthesia of skin: Secondary | ICD-10-CM

## 2013-08-08 DIAGNOSIS — R5383 Other fatigue: Secondary | ICD-10-CM

## 2013-08-08 DIAGNOSIS — R5381 Other malaise: Secondary | ICD-10-CM | POA: Insufficient documentation

## 2013-08-08 LAB — CBC WITH DIFFERENTIAL/PLATELET
BASOS ABS: 0 10*3/uL (ref 0.0–0.1)
Basophils Relative: 0 % (ref 0–1)
EOS ABS: 0.1 10*3/uL (ref 0.0–0.7)
EOS PCT: 2 % (ref 0–5)
HCT: 39.7 % (ref 36.0–46.0)
Hemoglobin: 12.7 g/dL (ref 12.0–15.0)
Lymphocytes Relative: 26 % (ref 12–46)
Lymphs Abs: 2.4 10*3/uL (ref 0.7–4.0)
MCH: 26 pg (ref 26.0–34.0)
MCHC: 32 g/dL (ref 30.0–36.0)
MCV: 81.2 fL (ref 78.0–100.0)
Monocytes Absolute: 0.5 10*3/uL (ref 0.1–1.0)
Monocytes Relative: 5 % (ref 3–12)
Neutro Abs: 6.3 10*3/uL (ref 1.7–7.7)
Neutrophils Relative %: 67 % (ref 43–77)
PLATELETS: 212 10*3/uL (ref 150–400)
RBC: 4.89 MIL/uL (ref 3.87–5.11)
RDW: 13.5 % (ref 11.5–15.5)
WBC: 9.4 10*3/uL (ref 4.0–10.5)

## 2013-08-08 LAB — TROPONIN I

## 2013-08-08 LAB — BASIC METABOLIC PANEL
BUN: 14 mg/dL (ref 6–23)
CALCIUM: 9 mg/dL (ref 8.4–10.5)
CO2: 26 meq/L (ref 19–32)
CREATININE: 0.7 mg/dL (ref 0.50–1.10)
Chloride: 103 mEq/L (ref 96–112)
GFR calc Af Amer: >90 mL/min (ref >90)
GLUCOSE: 99 mg/dL (ref 70–99)
Potassium: 3.7 mEq/L (ref 3.7–5.3)
Sodium: 141 mEq/L (ref 137–147)

## 2013-08-08 NOTE — ED Provider Notes (Addendum)
CSN: [id number]     Arrival date & time 08/08/13  1345 History   First MD Initiated Contact with Patient 08/08/13 1542    This chart was scribed for Doug Sou, MD by Ladona Ridgel Day, ED scribe. This patient was seen in room MH05/MH05 and the patient's care was started at 1542.  Chief Complaint  Patient presents with  . Numbness   The history is provided by the patient. No language interpreter was used.   HPI Comments: Virginia Vasquez is a 38 y.o. female who presents to the Emergency Department complaining of constant paraesthesias and numbness of her left lower leg and left forearm. Similar episode several in August 2014 for which she was evaluated in [facility] and had MRI scan of brain and CT scan of brain which were normal.. She reports that she was told likely from anxiety. She states recurrent similar and worsening episodes over the past few weeks. She states some decreased strength of her arm, worsened 2 weeks ago. She also reports some paraesthesias of her left breast. She also reports some pain between her shoulder blades over her back, onset yesterday. She states she currently feels fine. Nothing makes it better or worse but her symptoms tend to worsen throughout the day. She denies SOB. She states family hx of DM but doesn't know if she is diabetic. Her previous w/u showed her blood sugar was slightly elevated.  On mirena, she has no regular menstrual cycles.  She had cardiac ablation for tachycardia. She denies any recent tachycardia. No smoking or drinking. No illicit drug use. She has no allergies to medicines. Followed by regional physicians.   Past Medical History  Diagnosis Date  . Seasonal allergies   . Tachycardia    Past Surgical History  Procedure Laterality Date  . Ablation of dysrhythmic focus     No family history on file. History  Substance Use Topics  . Smoking status: Never Smoker   . Smokeless tobacco: Never Used  . Alcohol Use: No   OB  History   Grav Para Term Preterm Abortions TAB SAB Ect Mult Living                 Review of Systems  HENT: Negative.   Respiratory: Negative.  Negative for shortness of breath.   Cardiovascular: Negative.   Gastrointestinal: Negative.   Musculoskeletal: Negative.   Skin: Negative.   Neurological: Positive for weakness (mild weakness left arm) and numbness (parasthesias and numbness of left leg, left forearm and left chest).  Psychiatric/Behavioral: Negative.   All other systems reviewed and are negative.   A complete 10 system review of systems was obtained and all systems are negative except as noted in the HPI and PMH.   Allergies  Rofecoxib  Home Medications   Current Outpatient Rx  Name  Route  Sig  Dispense  Refill  . levonorgestrel (MIRENA) 20 MCG/24HR IUD   Intrauterine   1 each by Intrauterine route once. Inserted in April 2013         . Multiple Vitamin (MULTIVITAMIN WITH MINERALS) TABS   Oral   Take 1 tablet by mouth daily.          Triage Vitals :BP 132/91  Pulse 88  Temp(Src) 99.1 F (37.3 C) (Oral)  Resp 18  SpO2 100% Physical Exam  Nursing note and vitals reviewed. Constitutional: She is oriented to person, place, and time. She appears well-developed and well-nourished. No distress.  HENT:  Head: Normocephalic  and atraumatic.  Eyes: Conjunctivae are normal. Right eye exhibits no discharge. Left eye exhibits no discharge.  Neck: Normal range of motion.  Cardiovascular: Normal rate, regular rhythm and normal heart sounds.   No murmur heard. Pulmonary/Chest: Effort normal. No respiratory distress.  Musculoskeletal: Normal range of motion. She exhibits no edema.  Neurological: She is alert and oriented to person, place, and time. She has normal reflexes. No cranial nerve deficit. Coordination normal.  DTRs symmetric bilaterally knee jerk ankle jerk and biceps toes however bilaterally. Gait normal Romberg normal pronator drift normal finger to nose  normal motor strength 5 over 5 overall  Skin: Skin is warm and dry.  Psychiatric: She has a normal mood and affect. Thought content normal.   ED Course  Procedures (including critical care time) DIAGNOSTIC STUDIES: Oxygen Saturation is 100% on room air, normal by my interpretation.    COORDINATION OF CARE: At 345 PM Discussed treatment plan with patient which includes blood work. Patient agrees.   Labs Review Labs Reviewed - No data to display Imaging Review No results found.  EKG Interpretation    Date/Time:  Sunday August 08 2013 16:17:50 EST Ventricular Rate:  77 PR Interval:  164 QRS Duration: 82 QT Interval:  386 QTC Calculation: 436 R Axis:   48 Text Interpretation:  Normal sinus rhythm Normal ECG Since last tracing rate slower Confirmed by Ethelda ChickJACUBOWITZ  MD, Jamir Rone ([id number]) on 08/08/2013 4:53:31 PM           Results for orders placed during the hospital encounter of 08/08/13  TROPONIN I      Result Value Range   Troponin I <0.30  <0.30 ng/mL  BASIC METABOLIC PANEL      Result Value Range   Sodium 141  137 - 147 mEq/L   Potassium 3.7  3.7 - 5.3 mEq/L   Chloride 103  96 - 112 mEq/L   CO2 26  19 - 32 mEq/L   Glucose, Bld 99  70 - 99 mg/dL   BUN 14  6 - 23 mg/dL   Creatinine, Ser 1.610.70  0.50 - 1.10 mg/dL   Calcium 9.0  8.4 - 09.610.5 mg/dL   GFR calc non Af Amer >90  >90 mL/min   GFR calc Af Amer >90  >90 mL/min  CBC WITH DIFFERENTIAL      Result Value Range   WBC 9.4  4.0 - 10.5 K/uL   RBC 4.89  3.87 - 5.11 MIL/uL   Hemoglobin 12.7  12.0 - 15.0 g/dL   HCT 04.539.7  40.936.0 - 81.146.0 %   MCV 81.2  78.0 - 100.0 fL   MCH 26.0  26.0 - 34.0 pg   MCHC 32.0  30.0 - 36.0 g/dL   RDW 91.413.5  78.211.5 - 95.615.5 %   Platelets 212  150 - 400 K/uL   Neutrophils Relative % 67  43 - 77 %   Neutro Abs 6.3  1.7 - 7.7 K/uL   Lymphocytes Relative 26  12 - 46 %   Lymphs Abs 2.4  0.7 - 4.0 K/uL   Monocytes Relative 5  3 - 12 %   Monocytes Absolute 0.5  0.1 - 1.0 K/uL   Eosinophils Relative 2  0 - 5  %   Eosinophils Absolute 0.1  0.0 - 0.7 K/uL   Basophils Relative 0  0 - 1 %   Basophils Absolute 0.0  0.0 - 0.1 K/uL   No results found.  MDM  No diagnosis found.  I  expressed the patient the concern for multiple sclerosis given her symptoms. She is asked to call her primary care physician Dr. Dutch Gray for followup to arrange MRI scans of her spine or to arrange for neurologic followup Diagnosis paresthesias  I personally performed the services described in this documentation, which was scribed in my presence. The recorded information has been reviewed and considered.   Doug Sou, MD 08/08/13 1654  Doug Sou, MD 08/08/13 [phone]

## 2013-08-08 NOTE — Discharge Instructions (Signed)
Call Dr. Dutch Graymeara tomorrow or the next day to schedule an office visit. We feel that you need to have an evaluation for multiple sclerosis, which may require MRI scans of your spine or a consultation with  neurologist. The MRI scan of your brain that you received on 03/13/2013 was normal.

## 2013-08-08 NOTE — ED Notes (Signed)
[facility].

## 2013-08-08 NOTE — ED Notes (Signed)
Patient here with ongoing left lower arm numbness and left lower leg numbness. [facility] and they told her anxiety. States has no known anxiety and is having the numbness more frequently. Steady gait and no neuro deficits, also tired feeling in shoulder blades

## 2015-05-01 ENCOUNTER — Encounter (HOSPITAL_BASED_OUTPATIENT_CLINIC_OR_DEPARTMENT_OTHER): Payer: Self-pay | Admitting: *Deleted

## 2015-05-01 ENCOUNTER — Emergency Department (HOSPITAL_BASED_OUTPATIENT_CLINIC_OR_DEPARTMENT_OTHER)
Admission: EM | Admit: 2015-05-01 | Discharge: 2015-05-01 | Disposition: A | Discharge status: Home / Self Care | Payer: BC Managed Care – PPO | Attending: Emergency Medicine | Admitting: Emergency Medicine

## 2015-05-01 DIAGNOSIS — Y998 Other external cause status: Secondary | ICD-10-CM | POA: Insufficient documentation

## 2015-05-01 DIAGNOSIS — Y9289 Other specified places as the place of occurrence of the external cause: Secondary | ICD-10-CM | POA: Insufficient documentation

## 2015-05-01 DIAGNOSIS — W260XXA Contact with knife, initial encounter: Secondary | ICD-10-CM | POA: Insufficient documentation

## 2015-05-01 DIAGNOSIS — Z23 Encounter for immunization: Secondary | ICD-10-CM | POA: Insufficient documentation

## 2015-05-01 DIAGNOSIS — Y9389 Activity, other specified: Secondary | ICD-10-CM | POA: Diagnosis not present

## 2015-05-01 DIAGNOSIS — S61412A Laceration without foreign body of left hand, initial encounter: Secondary | ICD-10-CM

## 2015-05-01 DIAGNOSIS — S61012A Laceration without foreign body of left thumb without damage to nail, initial encounter: Secondary | ICD-10-CM | POA: Diagnosis present

## 2015-05-01 MED ORDER — SODIUM CHLORIDE 0.9 % IR SOLN
200.0000 mL | Freq: Once | Status: DC
  Filled 2015-05-01: qty 250

## 2015-05-01 MED ORDER — LIDOCAINE HCL 2 % IJ SOLN
5.0000 mL | Freq: Once | INTRAMUSCULAR | Status: AC
  Administered 2015-05-01: 100 mg
  Filled 2015-05-01: qty 20

## 2015-05-01 MED ORDER — TETANUS-DIPHTH-ACELL PERTUSSIS 5-2.5-18.5 LF-MCG/0.5 IM SUSP
0.5000 mL | Freq: Once | INTRAMUSCULAR | Status: AC
  Administered 2015-05-01: 0.5 mL via INTRAMUSCULAR
  Filled 2015-05-01: qty 0.5

## 2015-05-01 NOTE — ED Provider Notes (Addendum)
CSN: [id number]     Arrival date & time 05/01/15  1939 History   First MD Initiated Contact with Patient 05/01/15 2103     Chief Complaint  Patient presents with  . Laceration     (Consider location/radiation/quality/duration/timing/severity/associated sxs/prior Treatment) HPI Comments: 39 y.o. Female presents for left thumb laceration.  Patient was cutting cabbage when the knife slipped cutting her left thumb.  No other injury.  Accidental.  No intention for self harm.   Past Medical History  Diagnosis Date  . Seasonal allergies   . Tachycardia    Past Surgical History  Procedure Laterality Date  . Ablation of dysrhythmic focus     No family history on file. Social History  Substance Use Topics  . Smoking status: Never Smoker   . Smokeless tobacco: Never Used  . Alcohol Use: No   OB History    No data available     Review of Systems  Respiratory: Negative for chest tightness and shortness of breath.   Cardiovascular: Negative for chest pain.  Gastrointestinal: Negative for abdominal pain.  Genitourinary: Negative for flank pain.  Skin: Positive for wound (laceration of left thumb).  Neurological: Negative for weakness and numbness.  Hematological: Does not bruise/bleed easily.      Allergies  Rofecoxib  Home Medications   Prior to Admission medications   Medication Sig Start Date End Date Taking? Authorizing Provider  levonorgestrel (MIRENA) 20 MCG/24HR IUD 1 each by Intrauterine route once. Inserted in April 2013    Historical Provider, MD  Multiple Vitamin (MULTIVITAMIN WITH MINERALS) TABS Take 1 tablet by mouth daily.    Historical Provider, MD   BP 129/81 mmHg  Pulse 80  Temp(Src) 98.2 F (36.8 C) (Oral)  Resp 20  Ht 5' 4.5" (1.638 m)  Wt 164 lb (74.39 kg)  BMI 27.73 kg/m2  SpO2 100% Physical Exam  Constitutional: She is oriented to person, place, and time. She appears well-developed and well-nourished. No distress.  HENT:  Head: Normocephalic  and atraumatic.  Right Ear: External ear normal.  Left Ear: External ear normal.  Nose: Nose normal.  Mouth/Throat: Oropharynx is clear and moist. No oropharyngeal exudate.  Eyes: EOM are normal. Pupils are equal, round, and reactive to light.  Neck: Normal range of motion. Neck supple.  Cardiovascular: Normal rate, regular rhythm, normal heart sounds and intact distal pulses.   No murmur heard. Pulmonary/Chest: Effort normal. No respiratory distress. She has no wheezes. She has no rales.  Abdominal: Soft. She exhibits no distension. There is no tenderness.  Musculoskeletal: Normal range of motion. She exhibits no edema or tenderness.       Left hand: She exhibits laceration (triangular flap on the left thumb at the site depicted in red). She exhibits normal range of motion, no tenderness and normal capillary refill. Normal sensation noted. Normal strength noted.       Hands: Neurological: She is alert and oriented to person, place, and time.  Skin: Skin is warm and dry. No rash noted. She is not diaphoretic.  Vitals reviewed.   ED Course  .Marland KitchenLaceration Repair Date/Time: 05/02/2015 2:07 AM Performed by: Tyrone Apple ROE Authorized by: Leta Baptist Consent: Verbal consent obtained. Risks and benefits: risks, benefits and alternatives were discussed Consent given by: patient Required items: required blood products, implants, devices, and special equipment available Body area: upper extremity Location details: left thumb Laceration length: 1 cm Foreign bodies: no foreign bodies Tendon involvement: none Nerve involvement: none Vascular damage: no Anesthesia:  digital block Local anesthetic: lidocaine 2% without epinephrine Patient sedated: no Irrigation solution: saline Skin closure: 5-0 nylon Number of sutures: 6 Technique: simple   (including critical care time) Labs Review Labs Reviewed - No data to display  Imaging Review No results found. I have personally  reviewed and evaluated these images and lab results as part of my medical decision-making.   EKG Interpretation None      MDM  Patient seen and evaluated in stable condition.  Boostrix given.  Laceration closed without complication.  Patient instructed to follow up in 1 week for suture removal. Final diagnoses:  None    1. Left thumb laceration    Leta Baptist, MD 05/02/15 [id number]  Leta Baptist, MD 05/02/15 [phone]

## 2015-05-01 NOTE — ED Notes (Signed)
Wound care completed at [facility]. Bleeding controlled and gauze and coban applied.

## 2015-05-01 NOTE — ED Notes (Signed)
Laceration to her left thumb while cutting cabbage with a sharp knife.

## 2015-05-01 NOTE — ED Notes (Signed)
Pt states her wound is bleeding again.  She removed the pressure dressing from earlier bc it was painful, and the wound has since started bleeding again.  Pt is very upset with the wait time, tried explaining to her that the doctor was going to be in soon, but was finishing up with another patient.  Attempted to rewrap pt's thumb, but she refused stating it was going to start hurting again.  Attempted to explain to pt the need to wrap the wound tightly and elevate her hand in order to stop the bleeding.  Also offered pt some pain medication, but she states she has not eaten and started to get ready to leave AMA.  At this time, Dr. Cyndie Chime walked in the room to begin suturing so pt has decided to stay.

## 2015-05-01 NOTE — ED Notes (Signed)
MD at bedside. 

## 2015-05-01 NOTE — ED Notes (Signed)
Dr. Cyndie Chime spoke with patient and apologized about the wait time, pt is much happier now and verbalizes understanding of dc instructions.  She denies any further needs at this time.  Wound cleaned and dressing applied by Jonny Ruiz, EMT.

## 2015-05-01 NOTE — Discharge Instructions (Signed)
Laceration Care, Adult  Keep the area clean and dry and wash it gently daily with soap and water.  Follow up in one week for suture removal.    A laceration is a cut that goes through all of the layers of the skin and into the tissue that is right under the skin. Some lacerations heal on their own. Others need to be closed with stitches (sutures), staples, skin adhesive strips, or skin glue. Proper laceration care minimizes the risk of infection and helps the laceration to heal better. HOW TO CARE FOR YOUR LACERATION If sutures or staples were used:  Keep the wound clean and dry.  If you were given a bandage (dressing), you should change it at least one time per day or as told by your health care provider. You should also change it if it becomes wet or dirty.  Keep the wound completely dry for the first 24 hours or as told by your health care provider. After that time, you may shower or bathe. However, make sure that the wound is not soaked in water until after the sutures or staples have been removed.  Clean the wound one time each day or as told by your health care provider:  Wash the wound with soap and water.  Rinse the wound with water to remove all soap.  Pat the wound dry with a clean towel. Do not rub the wound.  After cleaning the wound, apply a thin layer of antibiotic ointmentas told by your health care provider. This will help to prevent infection and keep the dressing from sticking to the wound.  Have the sutures or staples removed as told by your health care provider. If skin adhesive strips were used:  Keep the wound clean and dry.  If you were given a bandage (dressing), you should change it at least one time per day or as told by your health care provider. You should also change it if it becomes dirty or wet.  Do not get the skin adhesive strips wet. You may shower or bathe, but be careful to keep the wound dry.  If the wound gets wet, pat it dry with a clean towel.  Do not rub the wound.  Skin adhesive strips fall off on their own. You may trim the strips as the wound heals. Do not remove skin adhesive strips that are still stuck to the wound. They will fall off in time. If skin glue was used:  Try to keep the wound dry, but you may briefly wet it in the shower or bath. Do not soak the wound in water, such as by swimming.  After you have showered or bathed, gently pat the wound dry with a clean towel. Do not rub the wound.  Do not do any activities that will make you sweat heavily until the skin glue has fallen off on its own.  Do not apply liquid, cream, or ointment medicine to the wound while the skin glue is in place. Using those may loosen the film before the wound has healed.  If you were given a bandage (dressing), you should change it at least one time per day or as told by your health care provider. You should also change it if it becomes dirty or wet.  If a dressing is placed over the wound, be careful not to apply tape directly over the skin glue. Doing that may cause the glue to be pulled off before the wound has healed.  Do not  pick at the glue. The skin glue usually remains in place for 5-10 days, then it falls off of the skin. General Instructions  Take over-the-counter and prescription medicines only as told by your health care provider.  If you were prescribed an antibiotic medicine or ointment, take or apply it as told by your doctor. Do not stop using it even if your condition improves.  To help prevent scarring, make sure to cover your wound with sunscreen whenever you are outside after stitches are removed, after adhesive strips are removed, or when glue remains in place and the wound is healed. Make sure to wear a sunscreen of at least 30 SPF.  Do not scratch or pick at the wound.  Keep all follow-up visits as told by your health care provider. This is important.  Check your wound every day for signs of infection. Watch  for:  Redness, swelling, or pain.  Fluid, blood, or pus.  Raise (elevate) the injured area above the level of your heart while you are sitting or lying down, if possible. SEEK MEDICAL CARE IF:  You received a tetanus shot and you have swelling, severe pain, redness, or bleeding at the injection site.  You have a fever.  A wound that was closed breaks open.  You notice a bad smell coming from your wound or your dressing.  You notice something coming out of the wound, such as wood or glass.  Your pain is not controlled with medicine.  You have increased redness, swelling, or pain at the site of your wound.  You have fluid, blood, or pus coming from your wound.  You notice a change in the color of your skin near your wound.  You need to change the dressing frequently due to fluid, blood, or pus draining from the wound.  You develop a new rash.  You develop numbness around the wound. SEEK IMMEDIATE MEDICAL CARE IF:  You develop severe swelling around the wound.  Your pain suddenly increases and is severe.  You develop painful lumps near the wound or on skin that is anywhere on your body.  You have a red streak going away from your wound.  The wound is on your hand or foot and you cannot properly move a finger or toe.  The wound is on your hand or foot and you notice that your fingers or toes look pale or bluish.   This information is not intended to replace advice given to you by your health care provider. Make sure you discuss any questions you have with your health care provider.   Document Released: 07/08/2005 Document Revised: 11/22/2014 Document Reviewed: 07/04/2014 [employer] Interactive Patient Education [employer]

## 2015-05-01 NOTE — ED Notes (Signed)
Curved laceration to pad of left thumb, approximately 1 inch long, approximates well, bleeding controlled.
